# Patient Record
Sex: Male | Born: 1985 | Hispanic: Yes | Marital: Single | Smoking: Current every day smoker
Health system: Southern US, Community
[De-identification: ages and names within clinical notes are randomized; demographics above are authoritative.]

## PROBLEM LIST (undated history)

## (undated) DIAGNOSIS — D696 Thrombocytopenia, unspecified: Secondary | ICD-10-CM

---

## 2015-07-24 ENCOUNTER — Encounter (HOSPITAL_COMMUNITY): Payer: Self-pay | Admitting: Emergency Medicine

## 2015-07-24 ENCOUNTER — Emergency Department (HOSPITAL_COMMUNITY)
Admission: EM | Admit: 2015-07-24 | Discharge: 2015-07-24 | Disposition: A | Discharge status: Home / Self Care | Payer: Self-pay | Attending: Emergency Medicine | Admitting: Emergency Medicine

## 2015-07-24 ENCOUNTER — Emergency Department (HOSPITAL_COMMUNITY): Payer: Self-pay

## 2015-07-24 DIAGNOSIS — Z720 Tobacco use: Secondary | ICD-10-CM | POA: Insufficient documentation

## 2015-07-24 DIAGNOSIS — Z862 Personal history of diseases of the blood and blood-forming organs and certain disorders involving the immune mechanism: Secondary | ICD-10-CM | POA: Insufficient documentation

## 2015-07-24 DIAGNOSIS — B2790 Infectious mononucleosis, unspecified without complication: Secondary | ICD-10-CM | POA: Insufficient documentation

## 2015-07-24 DIAGNOSIS — R000 Tachycardia, unspecified: Secondary | ICD-10-CM | POA: Insufficient documentation

## 2015-07-24 DIAGNOSIS — R197 Diarrhea, unspecified: Secondary | ICD-10-CM | POA: Insufficient documentation

## 2015-07-24 DIAGNOSIS — R1031 Right lower quadrant pain: Secondary | ICD-10-CM | POA: Insufficient documentation

## 2015-07-24 DIAGNOSIS — R1110 Vomiting, unspecified: Secondary | ICD-10-CM | POA: Insufficient documentation

## 2015-07-24 HISTORY — DX: Thrombocytopenia, unspecified: D69.6

## 2015-07-24 LAB — COMPREHENSIVE METABOLIC PANEL
ALT: 25 U/L (ref 17–63)
AST: 26 U/L (ref 15–41)
Albumin: 4.2 g/dL (ref 3.5–5.0)
Alkaline Phosphatase: 64 U/L (ref 38–126)
Anion gap: 10 (ref 5–15)
BILIRUBIN TOTAL: 1 mg/dL (ref 0.3–1.2)
BUN: 13 mg/dL (ref 6–20)
CHLORIDE: 98 mmol/L — AB (ref 101–111)
CO2: 24 mmol/L (ref 22–32)
Calcium: 9.3 mg/dL (ref 8.9–10.3)
Creatinine, Ser: 1 mg/dL (ref 0.61–1.24)
Glucose, Bld: 148 mg/dL — ABNORMAL HIGH (ref 65–99)
POTASSIUM: 4 mmol/L (ref 3.5–5.1)
SODIUM: 132 mmol/L — AB (ref 135–145)
Total Protein: 8.4 g/dL — ABNORMAL HIGH (ref 6.5–8.1)

## 2015-07-24 LAB — CBC WITH DIFFERENTIAL/PLATELET
Basophils Absolute: 0 10*3/uL (ref 0.0–0.1)
Basophils Relative: 0 % (ref 0–1)
EOS ABS: 0 10*3/uL (ref 0.0–0.7)
Eosinophils Relative: 0 % (ref 0–5)
HEMATOCRIT: 45.1 % (ref 39.0–52.0)
HEMOGLOBIN: 16.1 g/dL (ref 13.0–17.0)
LYMPHS ABS: 1.6 10*3/uL (ref 0.7–4.0)
LYMPHS PCT: 10 % — AB (ref 12–46)
MCH: 31.1 pg (ref 26.0–34.0)
MCHC: 35.7 g/dL (ref 30.0–36.0)
MCV: 87.1 fL (ref 78.0–100.0)
MONOS PCT: 10 % (ref 3–12)
Monocytes Absolute: 1.6 10*3/uL — ABNORMAL HIGH (ref 0.1–1.0)
NEUTROS ABS: 12.6 10*3/uL — AB (ref 1.7–7.7)
Neutrophils Relative %: 80 % — ABNORMAL HIGH (ref 43–77)
Platelets: 107 10*3/uL — ABNORMAL LOW (ref 150–400)
RBC: 5.18 MIL/uL (ref 4.22–5.81)
RDW: 15.3 % (ref 11.5–15.5)
WBC: 15.9 10*3/uL — ABNORMAL HIGH (ref 4.0–10.5)

## 2015-07-24 LAB — URINALYSIS, ROUTINE W REFLEX MICROSCOPIC
BILIRUBIN URINE: NEGATIVE
Glucose, UA: NEGATIVE mg/dL
Ketones, ur: NEGATIVE mg/dL
Leukocytes, UA: NEGATIVE
Nitrite: NEGATIVE
PH: 5.5 (ref 5.0–8.0)
Protein, ur: 30 mg/dL — AB
Specific Gravity, Urine: 1.023 (ref 1.005–1.030)
UROBILINOGEN UA: 0.2 mg/dL (ref 0.0–1.0)

## 2015-07-24 LAB — MONONUCLEOSIS SCREEN: MONO SCREEN: NEGATIVE

## 2015-07-24 LAB — URINE MICROSCOPIC-ADD ON

## 2015-07-24 LAB — I-STAT CG4 LACTIC ACID, ED: Lactic Acid, Venous: 1.51 mmol/L (ref 0.5–2.0)

## 2015-07-24 MED ORDER — SODIUM CHLORIDE 0.9 % IV BOLUS (SEPSIS)
1000.0000 mL | Freq: Once | INTRAVENOUS | Status: AC
  Administered 2015-07-24: 1000 mL via INTRAVENOUS

## 2015-07-24 MED ORDER — ACETAMINOPHEN 325 MG PO TABS
325.0000 mg | ORAL_TABLET | Freq: Once | ORAL | Status: AC
  Administered 2015-07-24: 325 mg via ORAL

## 2015-07-24 MED ORDER — IOHEXOL 300 MG/ML  SOLN
100.0000 mL | Freq: Once | INTRAMUSCULAR | Status: AC | PRN
  Administered 2015-07-24: 100 mL via INTRAVENOUS

## 2015-07-24 MED ORDER — MORPHINE SULFATE 4 MG/ML IJ SOLN
8.0000 mg | Freq: Once | INTRAMUSCULAR | Status: AC
  Administered 2015-07-24: 8 mg via INTRAVENOUS
  Filled 2015-07-24: qty 2

## 2015-07-24 MED ORDER — ACETAMINOPHEN 325 MG PO TABS
325.0000 mg | ORAL_TABLET | Freq: Once | ORAL | Status: AC
  Administered 2015-07-24: 325 mg via ORAL
  Filled 2015-07-24: qty 1

## 2015-07-24 NOTE — ED Notes (Signed)
Pt made aware to come back if he starts feeling worse or has any concerning s/s. Verbalized understanding.

## 2015-07-24 NOTE — Discharge Instructions (Signed)
Infectious Mononucleosis  Infectious mononucleosis (mono) is a common germ (viral) infection in children, teenagers, and young adults.   CAUSES   Mono is an infection caused by the Epstein Barr virus. The virus is spread by close personal contact with someone who has the infection. It can be passed by contact with your saliva through things such as kissing or sharing drinking glasses. Sometimes, the infection can be spread from someone who does not appear sick but still spreads the virus (asymptomatic carrier state).   SYMPTOMS   The most common symptoms of Mono are:  · Sore throat.  · Headache.  · Fatigue.  · Muscle aches.  · Swollen glands.  · Fever.  · Poor appetite.  · Enlarged liver or spleen.  The less common symptoms can include:  · Rash.  · Feeling sick to your stomach (nauseous).  · Abdominal pain.  DIAGNOSIS   Mono is diagnosed by a blood test.   TREATMENT   Treatment of mono is usually at home. There is no medicine that cures this virus. Sometimes hospital treatment is needed in severe cases. Steroid medicine sometimes is needed if the swelling in the throat causes breathing or swallowing problems.   HOME CARE INSTRUCTIONS   · Drink enough fluids to keep your urine clear or pale yellow.  · Eat soft foods. Cool foods like popsicles or ice cream can soothe a sore throat.  · Only take over-the-counter or prescription medicines for pain, discomfort, or fever as directed by your caregiver. Children under 18 years of age should not take aspirin.  · Gargle salt water. This may help relieve your sore throat. Put 1 teaspoon (tsp) of salt in 1 cup of warm water. Sucking on hard candy may also help.  · Rest as needed.  · Start regular activities gradually after the fever is gone. Be sure to rest when tired.  · Avoid strenuous exercise or contact sports until your caregiver says it is okay. The liver and spleen could be seriously injured.  · Avoid sharing drinking glasses or kissing until your caregiver tells you  that you are no longer contagious.  SEEK MEDICAL CARE IF:   · Your fever is not gone after 7 days.  · Your activity level is not back to normal after 2 weeks.  · You have yellow coloring to eyes and skin (jaundice).  SEEK IMMEDIATE MEDICAL CARE IF:   · You have severe pain in the abdomen or shoulder.  · You have trouble swallowing or drooling.  · You have trouble breathing.  · You develop a stiff neck.  · You develop a severe headache.  · You cannot stop throwing up (vomiting).  · You have convulsions.  · You are confused.  · You have trouble with balance.  · You develop signs of body fluid loss (dehydration):  ¨ Weakness.  ¨ Sunken eyes.  ¨ Pale skin.  ¨ Dry mouth.  ¨ Rapid breathing or pulse.  MAKE SURE YOU:   · Understand these instructions.  · Will watch your condition.  · Will get help right away if you are not doing well or get worse.  Document Released: 11/30/2000 Document Revised: 02/25/2012 Document Reviewed: 09/28/2008  [employer]® Patient Information ©2015 [employer]. This information is not intended to replace advice given to you by your health care provider. Make sure you discuss any questions you have with your health care provider.

## 2015-07-24 NOTE — ED Provider Notes (Signed)
CSN: [id number]     Arrival date & time 07/24/15  0902 History   First MD Initiated Contact with Patient 07/24/15 [phone]     Chief Complaint  Patient presents with  . Fever  . Emesis     (Consider location/radiation/quality/duration/timing/severity/associated sxs/prior Treatment) Patient is a 29 y.o. male presenting with fever and vomiting. The history is provided by the patient.  Fever Max temp prior to arrival:  102 Temp source:  Oral Severity:  Moderate Onset quality:  Gradual Duration:  5 days Timing:  Constant Progression:  Unchanged Chronicity:  New Relieved by:  Nothing Worsened by:  Nothing tried Ineffective treatments:  None tried Associated symptoms: chills, diarrhea (for 5 days), myalgias and vomiting (starting today)   Emesis Associated symptoms: abdominal pain (Starting in mid abdomen moving to right side over the course of the morning), chills, diarrhea (for 5 days) and myalgias     Past Medical History  Diagnosis Date  . Thrombocytopenia    History reviewed. No pertinent past surgical history. No family history on file. History  Substance Use Topics  . Smoking status: Current Every Day Smoker  . Smokeless tobacco: Not on file  . Alcohol Use: No    Review of Systems  Constitutional: Positive for fever and chills.  Gastrointestinal: Positive for vomiting (starting today), abdominal pain (Starting in mid abdomen moving to right side over the course of the morning) and diarrhea (for 5 days).  Musculoskeletal: Positive for myalgias.  All other systems reviewed and are negative.     Allergies  Review of patient's allergies indicates not on file.  Home Medications   Prior to Admission medications   Not on File   BP 137/70 mmHg  Pulse 110  Temp(Src) 100 F (37.8 C) (Oral)  Resp 24  Ht 5\' 6"  (1.676 m)  Wt 234 lb (106.142 kg)  BMI 37.79 kg/m2  SpO2 98% Physical Exam  Constitutional: He is oriented to person, place, and time. He appears  well-developed and well-nourished. No distress.  HENT:  Head: Normocephalic and atraumatic.  Eyes: Conjunctivae are normal.  Neck: Neck supple. No tracheal deviation present.  Cardiovascular: Regular rhythm and normal heart sounds.  Tachycardia present.   Pulmonary/Chest: Effort normal and breath sounds normal. No respiratory distress.  Abdominal: Soft. Normal appearance. He exhibits no distension. There is tenderness in the right lower quadrant. There is guarding. There is no rigidity.  Neurological: He is alert and oriented to person, place, and time.  Skin: Skin is warm and dry.  Psychiatric: He has a normal mood and affect.    ED Course  Procedures (including critical care time) Labs Review Labs Reviewed  COMPREHENSIVE METABOLIC PANEL - Abnormal; Notable for the following:    Sodium 132 (*)    Chloride 98 (*)    Glucose, Bld 148 (*)    Total Protein 8.4 (*)    All other components within normal limits  CBC WITH DIFFERENTIAL/PLATELET - Abnormal; Notable for the following:    WBC 15.9 (*)    Neutrophils Relative % 80 (*)    Neutro Abs 12.6 (*)    Lymphocytes Relative 10 (*)    Monocytes Absolute 1.6 (*)    All other components within normal limits  URINE CULTURE  CULTURE, BLOOD (ROUTINE X 2)  CULTURE, BLOOD (ROUTINE X 2)  URINALYSIS, ROUTINE W REFLEX MICROSCOPIC (NOT AT [facility])  I-STAT CG4 LACTIC ACID, ED    Imaging Review Dg Chest 2 View  07/24/2015   CLINICAL DATA:  Fever to 102 degrees, smoker, history thrombocytopenia  EXAM: CHEST  2 VIEW  COMPARISON:  None  FINDINGS: Normal heart size, mediastinal contours, and pulmonary vascularity.  Minimal central peribronchial thickening.  Lungs otherwise clear.  No pulmonary infiltrate, pleural effusion or pneumothorax.  Bones unremarkable.  IMPRESSION: Minimal bronchitic changes without infiltrate.   Electronically Signed   By: Ulyses Southward M.D.   On: 07/24/2015 10:16   I independently viewed and interpreted the above radiology  studies and agree with radiologist report.   EKG Interpretation None      MDM   Final diagnoses:  Mononucleosis syndrome    29 year old male presents with viral prodrome symptoms with cough, sore throat, diarrhea for 5 days. He had onset of vomiting this morning, high fever, new onset periumbilical abdominal pain that migrated to the right lower quadrant. He has right lower quadrant tenderness on arrival and appears septic, CT ordered for evaluation of possible appendicitis.  CT negative for appendicitis. Splenomegaly noted on CT. Pt has vast improvement in clinical symptoms following IVF and was able to eat in ED. Continues to be tachycardic, was monitored without any decline in linical condition. No evidence of end organ damage and no lactate elevation. Given clinical syndrome and CT findings, there is concern for mononucleosis of unknown organism. No bacterial source of infection identified, no s/s of meningitis. Not immunosuppressed. Suspect viral etiology currently. Pt wishing to leave, strict return precautions discussed for worsening and given precautions for contact sports to prevent splenic injury. Lyndal Pulley, MD 07/24/15 2258

## 2015-07-24 NOTE — ED Notes (Signed)
Patient transported to CT 

## 2015-07-24 NOTE — ED Notes (Signed)
MD to bedside to assess patient. Pt reports he is feeling better and wants to leave at 4 pm to pick up his brother.

## 2015-07-24 NOTE — ED Notes (Signed)
Pt. Stated, I've been having chills with fever nausea vomiting for 5 days.

## 2015-07-26 LAB — URINE CULTURE

## 2015-07-27 ENCOUNTER — Telehealth (HOSPITAL_COMMUNITY): Payer: Self-pay

## 2015-07-27 NOTE — Progress Notes (Signed)
ED Antimicrobial Stewardship Positive Culture Follow Up   Aaron Cummings is an 29 y.o. male who presented to [facility] on 07/24/2015 with a chief complaint of  Chief Complaint  Patient presents with  . Fever  . Emesis    Recent Results (from the past 720 hour(s))  Blood culture (routine x 2)     Status: None (Preliminary result)   Collection Time: 07/24/15  9:46 AM  Result Value Ref Range Status   Specimen Description BLOOD LEFT ARM  Final   Special Requests   Final    BOTTLES DRAWN AEROBIC AND ANAEROBIC  10CC BLUE 5CC RED   Culture NO GROWTH 2 DAYS  Final   Report Status PENDING  Incomplete  Urine culture     Status: None   Collection Time: 07/24/15 11:20 AM  Result Value Ref Range Status   Specimen Description URINE, CLEAN CATCH  Final   Special Requests NONE  Final   Culture 20,000 COLONIES/mL ENTEROCOCCUS SPECIES  Final   Report Status 07/26/2015 FINAL  Final   Organism ID, Bacteria ENTEROCOCCUS SPECIES  Final      Susceptibility   Enterococcus species - MIC*    AMPICILLIN <=2 SENSITIVE Sensitive     LEVOFLOXACIN 0.5 SENSITIVE Sensitive     NITROFURANTOIN <=16 SENSITIVE Sensitive     VANCOMYCIN 1 SENSITIVE Sensitive     * 20,000 COLONIES/mL ENTEROCOCCUS SPECIES  Blood culture (routine x 2)     Status: None (Preliminary result)   Collection Time: 07/24/15 11:40 AM  Result Value Ref Range Status   Specimen Description BLOOD RIGHT ARM  Final   Special Requests BOTTLES DRAWN AEROBIC ONLY 10CC  Final   Culture NO GROWTH 2 DAYS  Final   Report Status PENDING  Incomplete    Patient discharged originally without antimicrobial agent, no urinary symptoms, will not treat.  ED Provider: Jaynie Crumble, PA-C   Ancil Boozer 07/27/2015, 8:57 AM Infectious Diseases Pharmacist Phone# [phone]

## 2015-07-27 NOTE — Telephone Encounter (Signed)
Post ED Visit - Positive Culture Follow-up  Culture report reviewed by antimicrobial stewardship pharmacist:  Wes Dulaney, Pharm.D., BCPS  Celedonio Miyamoto, Pharm.D., BCPS  Georgina Pillion, Pharm.D., BCPS  [employer], [address].D., BCPS, AAHIVP  Estella Husk, Pharm.D., BCPS, AAHIVP  [place] [place], [address].D., BCPS X  [facility],T Pharm D   Positive Urine culture, 20,000 colonies -> Enterococcus Spec. Chart reviewed by Angelique Holm PA (No Tx)   Arvid Right 07/27/2015, 11:49 AM

## 2015-07-29 LAB — CULTURE, BLOOD (ROUTINE X 2)
Culture: NO GROWTH
Culture: NO GROWTH

## 2017-02-14 IMAGING — DX DG CHEST 2V
2 series · 2 of 2 positions shown · non-contrast
Comparison: None

CLINICAL DATA: Fever to 102 degrees, smoker, history
thrombocytopenia

EXAM:
CHEST  2 VIEW

[chest pa]
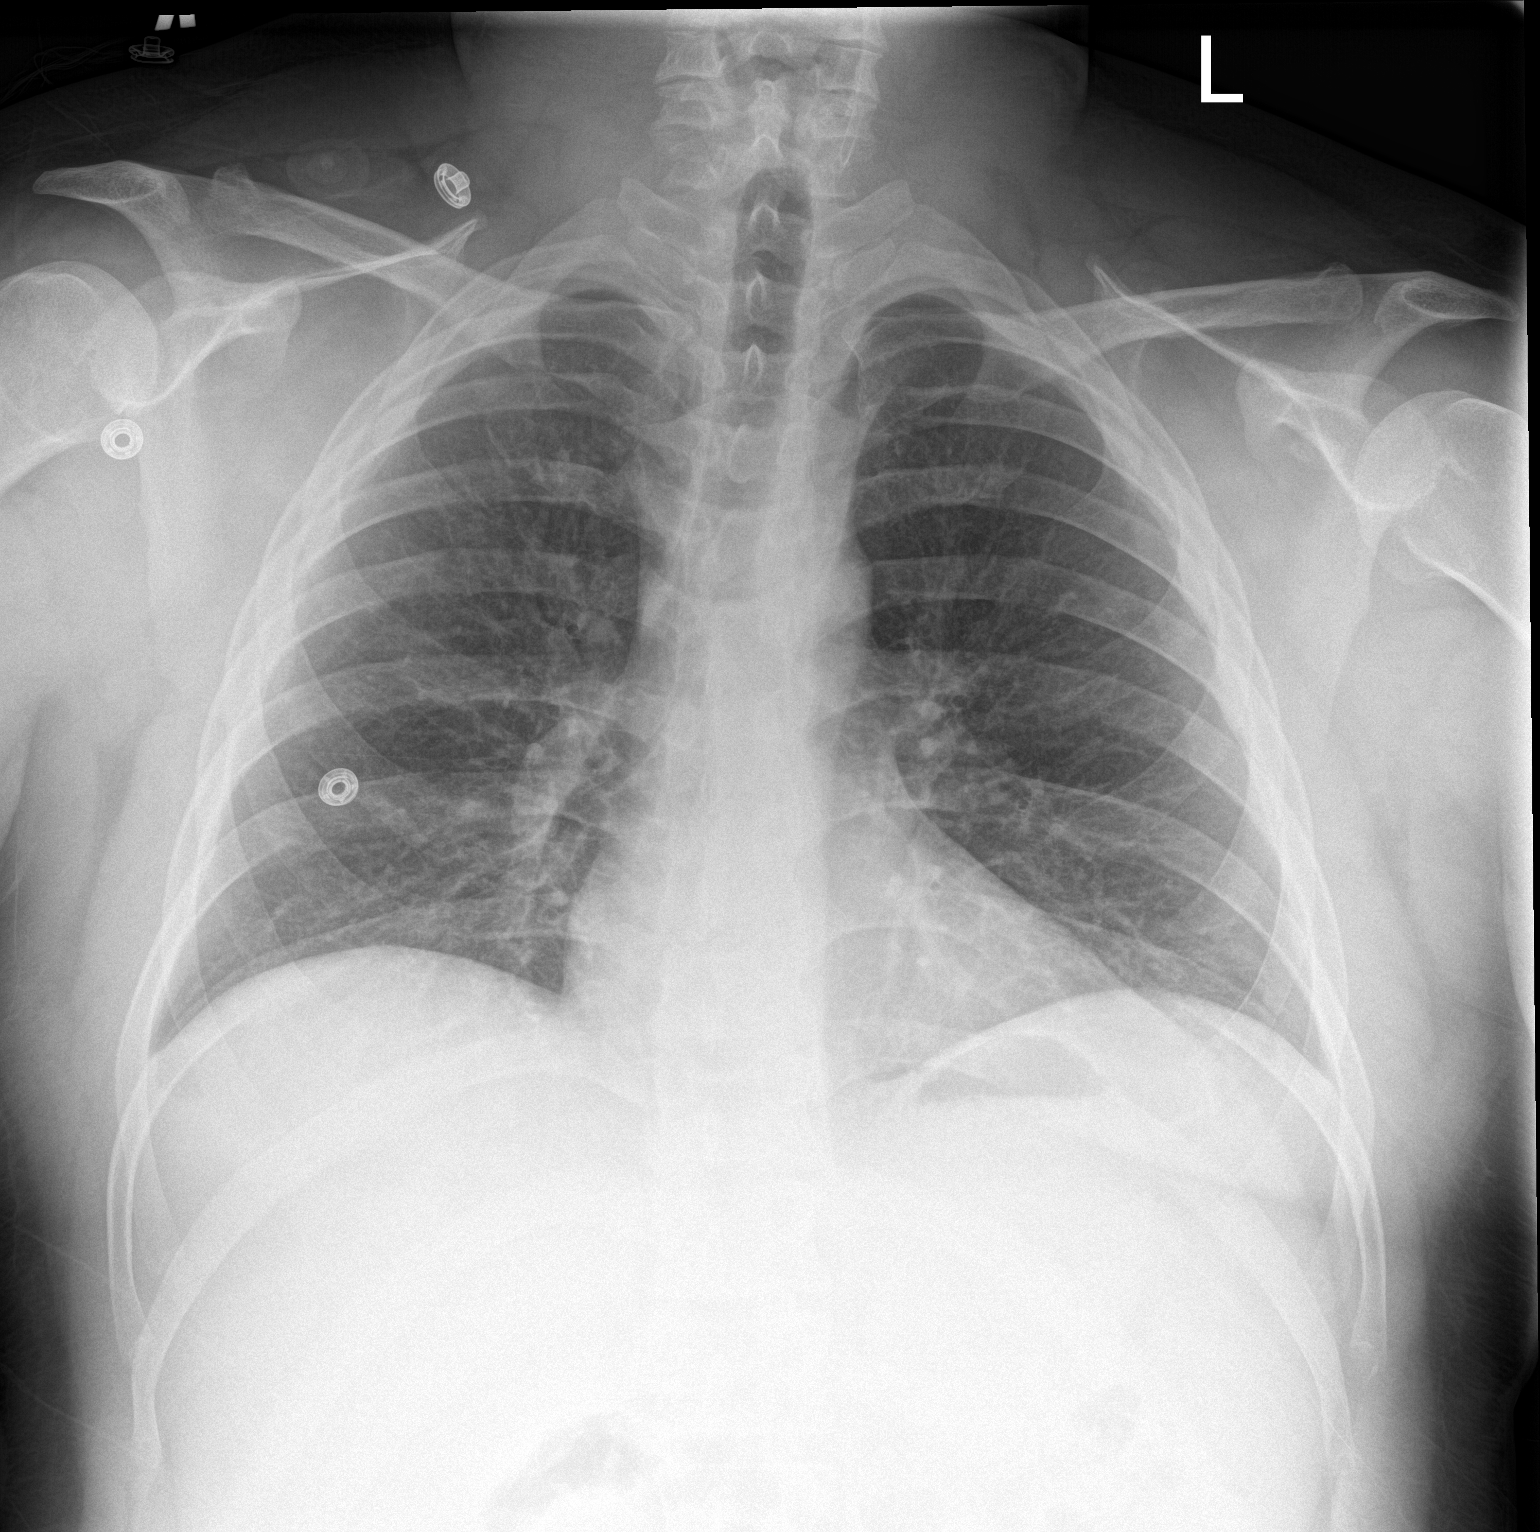

[chest lat]
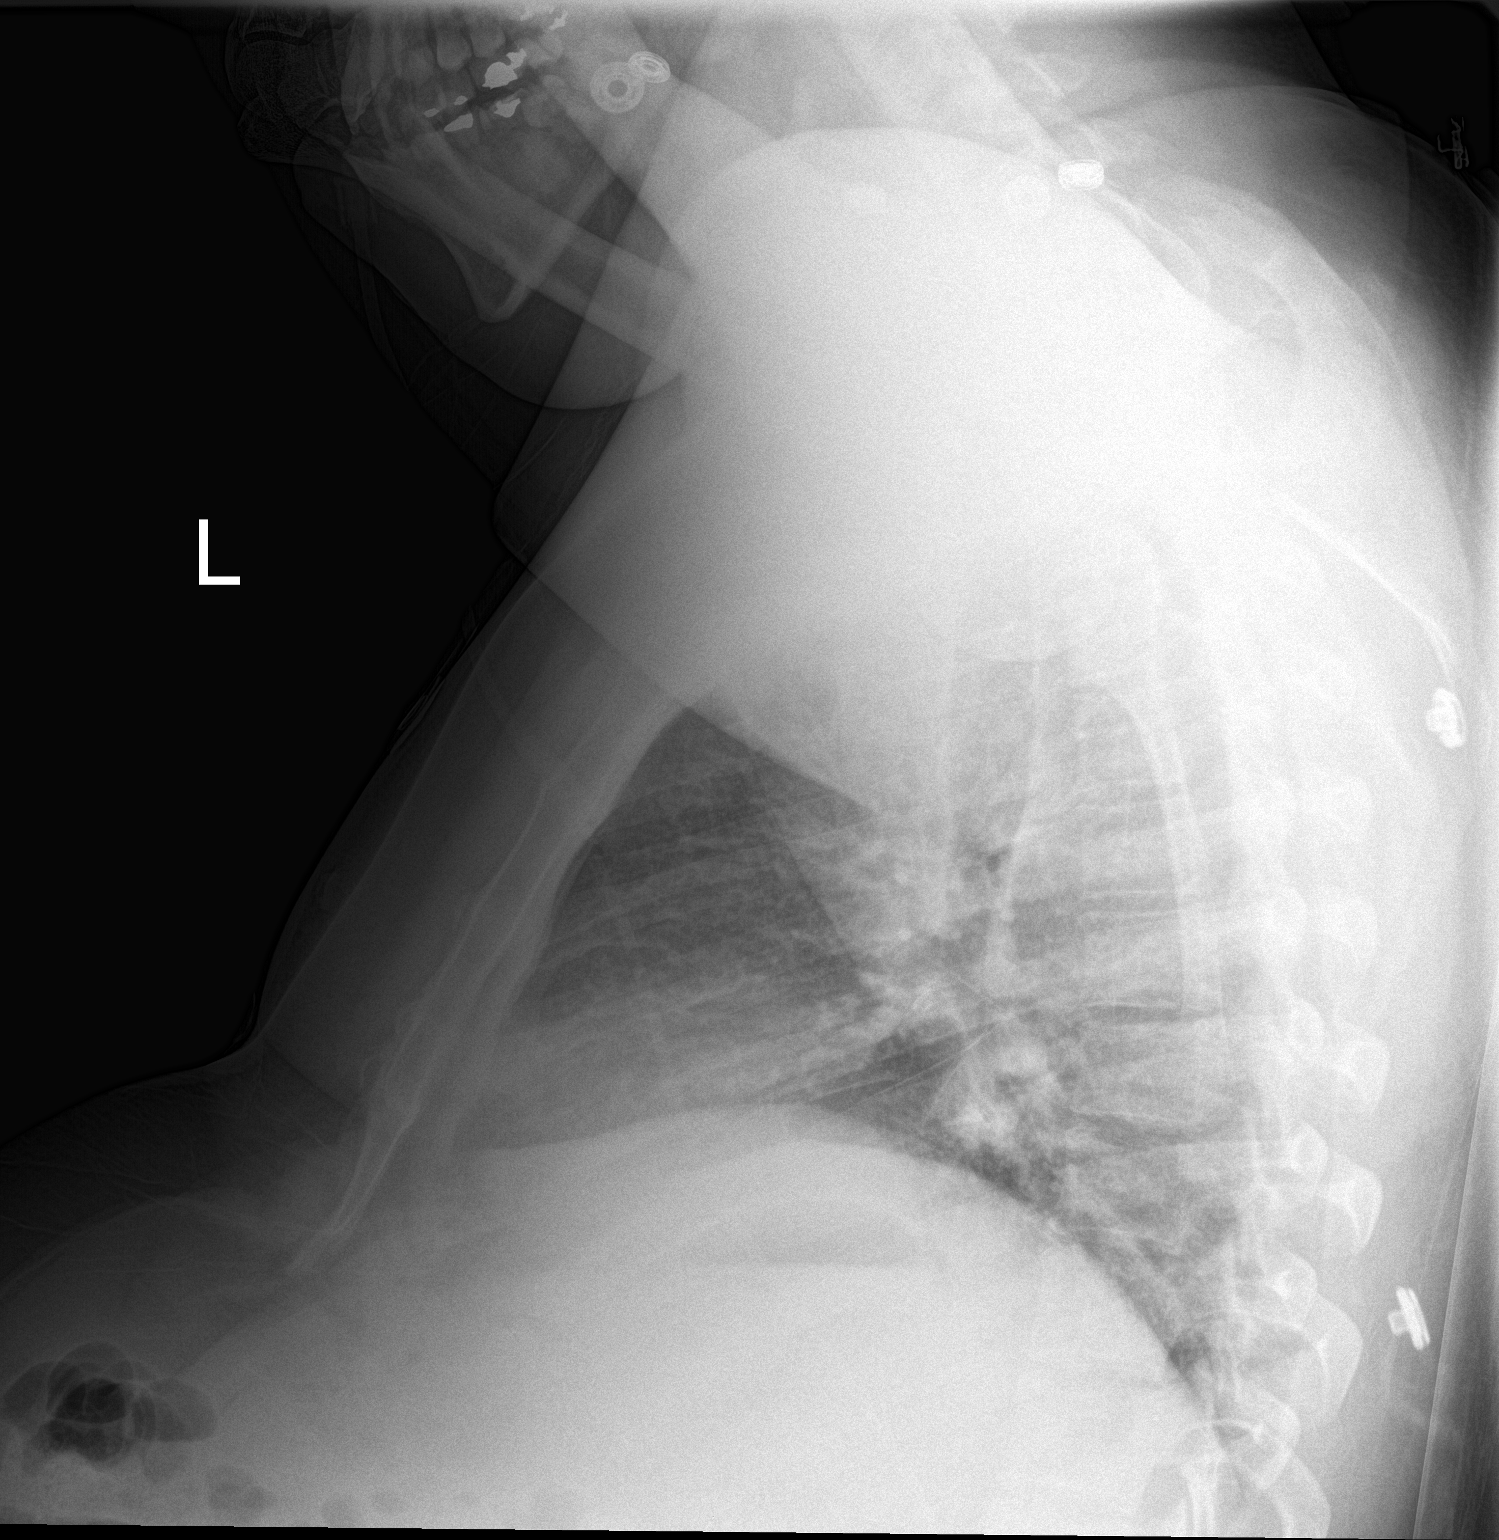

[2 of 2 positions shown; findings below may reference images not displayed]

FINDINGS: Normal heart size, mediastinal contours, and pulmonary vascularity.

Minimal central peribronchial thickening.

Lungs otherwise clear.

No pulmonary infiltrate, pleural effusion or pneumothorax.

Bones unremarkable.
IMPRESSION: Minimal bronchitic changes without infiltrate.

## 2017-02-14 IMAGING — CT CT ABD-PELV W/ CM
2 of 4 series · 15 of 46 positions shown, 17 images · IV contrast (Omni 300)
Comparison: None.

CLINICAL DATA: 28-year-old male with a history of right lower
quadrant pain for 5 days.

EXAM:
CT ABDOMEN AND PELVIS WITH CONTRAST
TECHNIQUE: Multidetector CT imaging of the abdomen and pelvis was performed
using the standard protocol following bolus administration of
intravenous contrast.
CONTRAST:  100mL OMNIPAQUE IOHEXOL 300 MG/ML  SOLN

[Series 2: abd/ pelvis 5.0 i30f 1 · axial · 0.90mm/px · z∈[+848,+1334]mm · 12 of 107 slices shown, 14 images]
[im 5/107  soft-tissue]
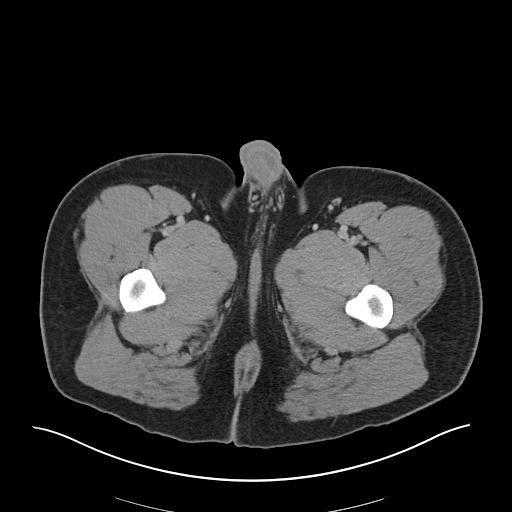
[im 5/107  bone]
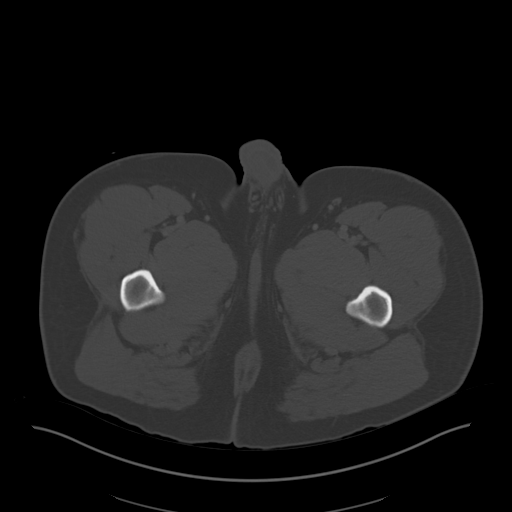
[im 14/107  soft-tissue]
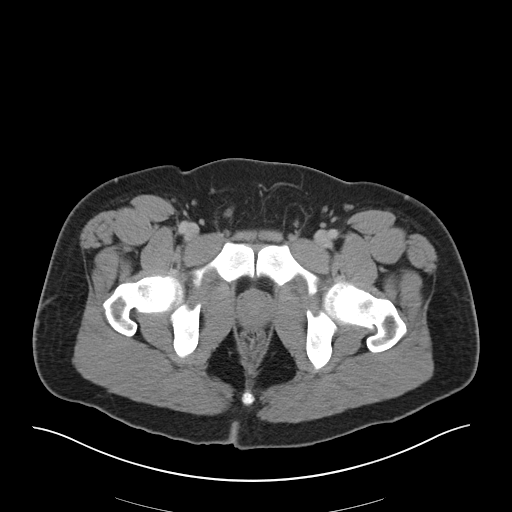
[im 23/107  soft-tissue]
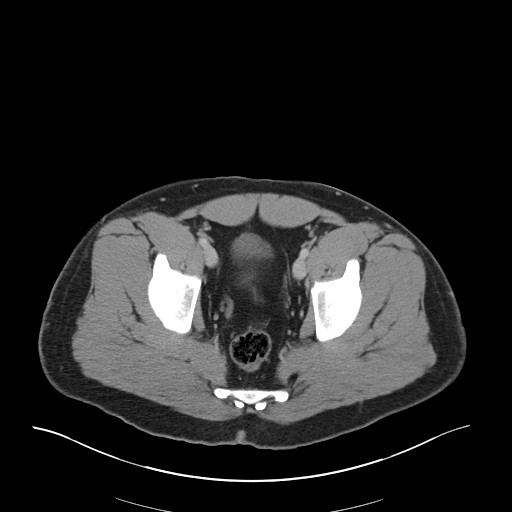
[im 31/107  soft-tissue]
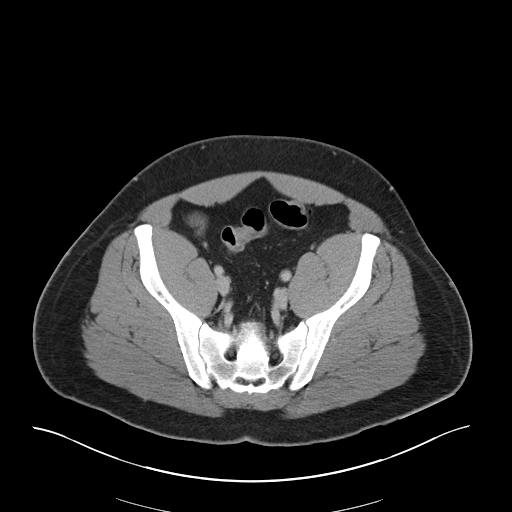
[im 40/107  soft-tissue]
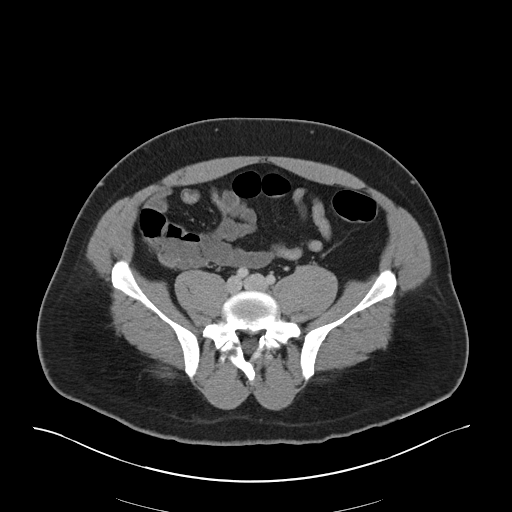
[im 49/107  soft-tissue]
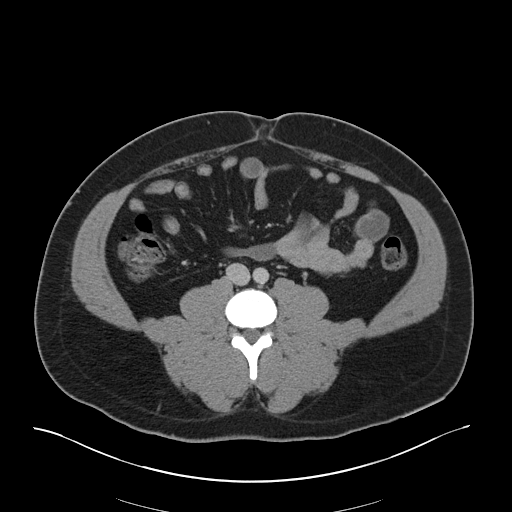
[im 58/107  soft-tissue]
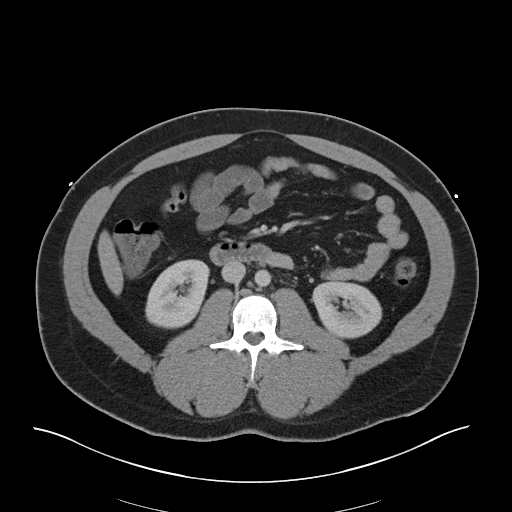
[im 67/107  soft-tissue]
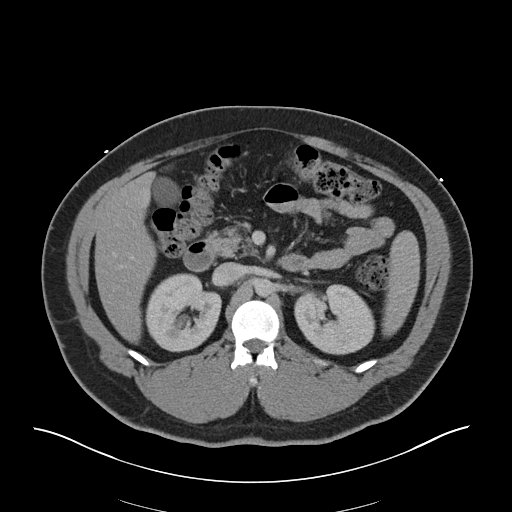
[im 76/107  soft-tissue]
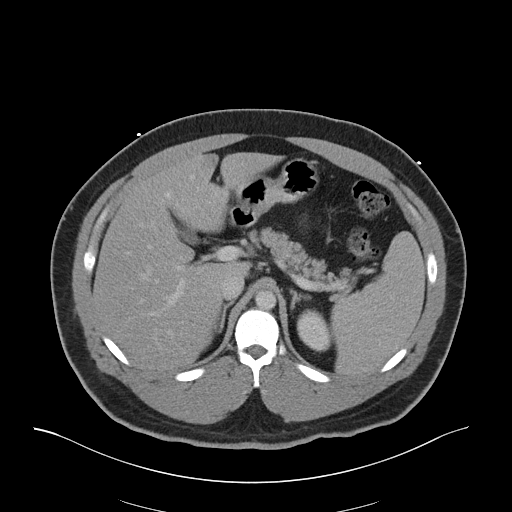
[im 76/107  bone]
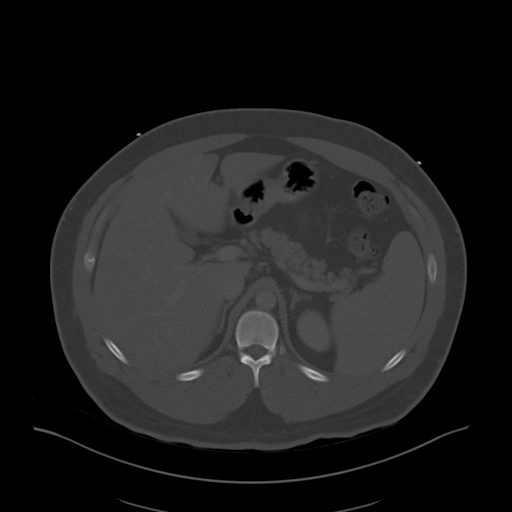
[im 84/107  soft-tissue]
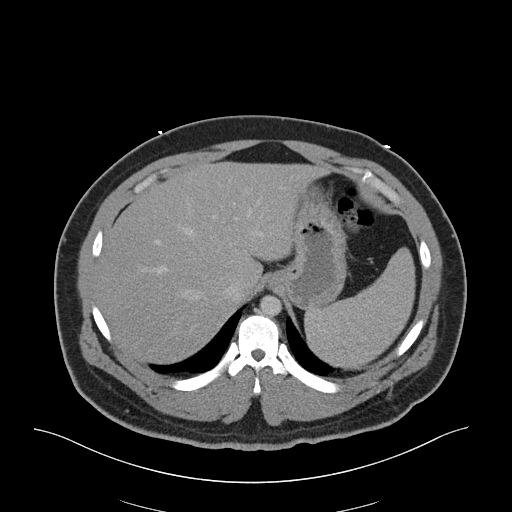
[im 93/107  soft-tissue]
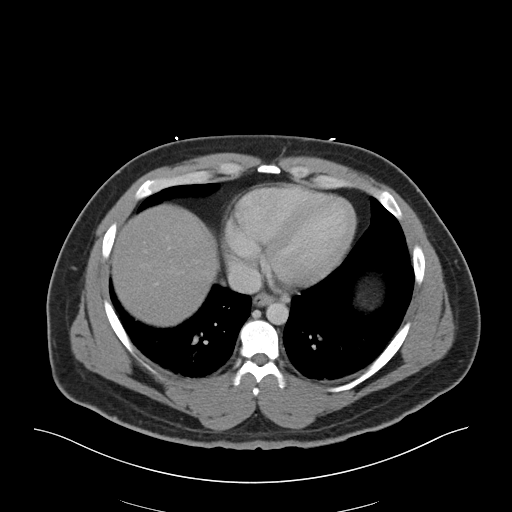
[im 102/107  soft-tissue]
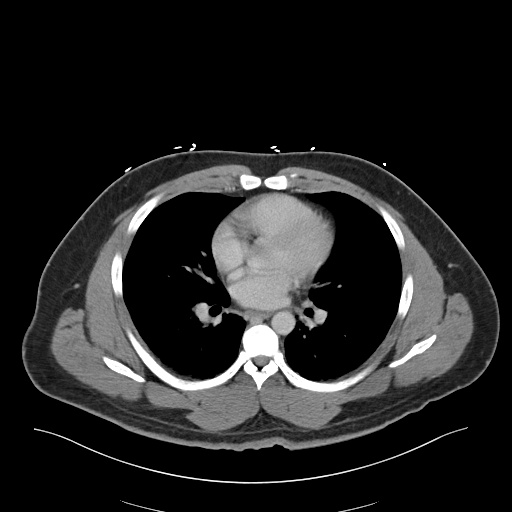

[Series 5: coronals · coronal · 0.78mm/px · 3 of 155 slices shown]
[im 52/155  soft-tissue]
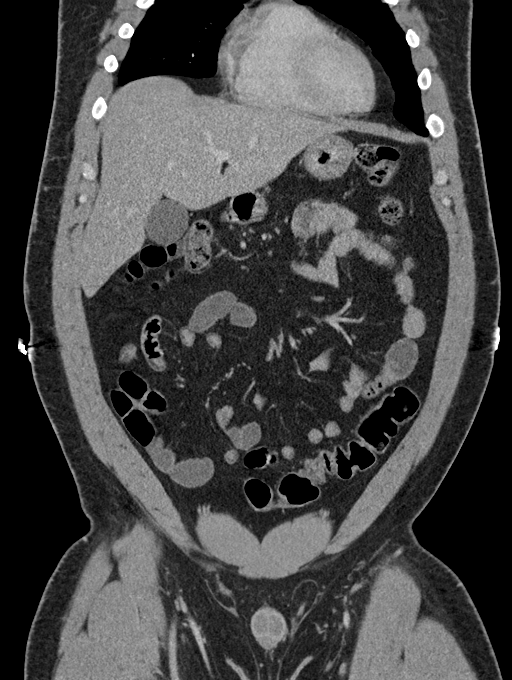
[im 69/155  soft-tissue]
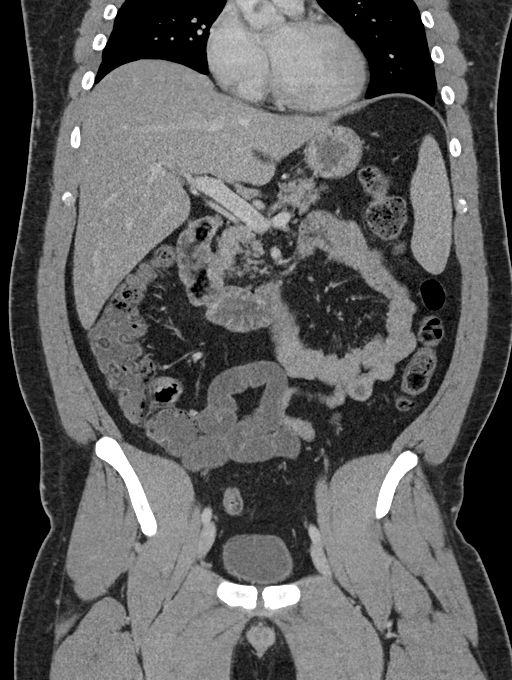
[im 86/155  soft-tissue]
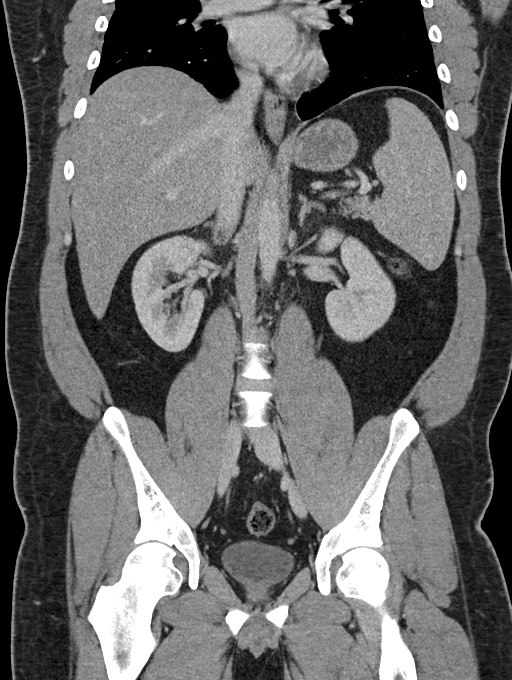

[15 of 46 positions shown; findings below may reference images not displayed]

FINDINGS: Lower chest:

Unremarkable appearance of the soft tissues of the chest wall.

Heart size within normal limits.  No pericardial fluid/thickening.

No lower mediastinal adenopathy.

Unremarkable appearance of the distal esophagus.

No hiatal hernia.

No confluent airspace disease, pleural fluid, or pneumothorax within
visualized lung.

Abdomen/pelvis:

Unremarkable appearance of liver. Cranial caudal span of the spleen
measures 14 cm- 15 cm.

Unremarkable appearance of bilateral adrenal glands.

No peripancreatic or pericholecystic fluid or inflammatory changes.

No radio-opaque gallstones.

No intrahepatic or extrahepatic biliary ductal dilatation.

No intra-peritoneal free air or significant free-fluid.

No abnormally dilated small bowel or colon. No transition point. No
inflammatory changes of the mesenteries.

Normal appendix identified.

No diverticular disease.

Right Kidney/Ureter:

No hydronephrosis. No nephrolithiasis. No perinephric stranding.
Unremarkable course of the right ureter.

Left Kidney/Ureter:

No hydronephrosis. No nephrolithiasis. No perinephric stranding.

Unremarkable course of the left ureter.

Unremarkable appearance of the urinary bladder.

Fact containing umbilical hernia.

No significant vascular calcification. No aneurysm or periaortic
fluid identified.

Musculoskeletal:

No displaced fracture identified.

Early degenerative disc disease of the L5-S1 level.
IMPRESSION: No acute finding on the abdomen/ pelvis CT to account for the
patient's symptoms.

Borderline splenomegaly. Correlation with lab values may be useful
if there were concern for hematologic/myeloproliferative disorder.
